# Patient Record
Sex: Female | Born: 1950 | Race: White | Hispanic: No | Smoking: Former smoker
Health system: Southern US, Community
[De-identification: ages and names within clinical notes are randomized; demographics above are authoritative.]

## PROBLEM LIST (undated history)

## (undated) DIAGNOSIS — Z5189 Encounter for other specified aftercare: Secondary | ICD-10-CM

## (undated) DIAGNOSIS — E119 Type 2 diabetes mellitus without complications: Secondary | ICD-10-CM

## (undated) DIAGNOSIS — J449 Chronic obstructive pulmonary disease, unspecified: Secondary | ICD-10-CM

## (undated) HISTORY — PX: ABDOMINAL HYSTERECTOMY: SHX81

## (undated) HISTORY — PX: CHOLECYSTECTOMY: SHX55

## (undated) HISTORY — PX: BREAST SURGERY: SHX581

---

## 2020-04-30 ENCOUNTER — Emergency Department: Payer: Medicare HMO

## 2020-04-30 ENCOUNTER — Emergency Department
Admission: EM | Admit: 2020-04-30 | Discharge: 2020-04-30 | Disposition: A | Discharge status: Home / Self Care | Payer: Medicare HMO | Attending: Student | Admitting: Student

## 2020-04-30 ENCOUNTER — Encounter: Payer: Self-pay | Admitting: Emergency Medicine

## 2020-04-30 ENCOUNTER — Other Ambulatory Visit: Payer: Self-pay

## 2020-04-30 DIAGNOSIS — Y929 Unspecified place or not applicable: Secondary | ICD-10-CM | POA: Insufficient documentation

## 2020-04-30 DIAGNOSIS — W010XXA Fall on same level from slipping, tripping and stumbling without subsequent striking against object, initial encounter: Secondary | ICD-10-CM | POA: Diagnosis not present

## 2020-04-30 DIAGNOSIS — S59911A Unspecified injury of right forearm, initial encounter: Secondary | ICD-10-CM | POA: Diagnosis present

## 2020-04-30 DIAGNOSIS — Z79899 Other long term (current) drug therapy: Secondary | ICD-10-CM | POA: Diagnosis not present

## 2020-04-30 DIAGNOSIS — S52501A Unspecified fracture of the lower end of right radius, initial encounter for closed fracture: Secondary | ICD-10-CM | POA: Diagnosis not present

## 2020-04-30 DIAGNOSIS — J449 Chronic obstructive pulmonary disease, unspecified: Secondary | ICD-10-CM | POA: Insufficient documentation

## 2020-04-30 DIAGNOSIS — Z87891 Personal history of nicotine dependence: Secondary | ICD-10-CM | POA: Insufficient documentation

## 2020-04-30 DIAGNOSIS — Z7982 Long term (current) use of aspirin: Secondary | ICD-10-CM | POA: Diagnosis not present

## 2020-04-30 DIAGNOSIS — E119 Type 2 diabetes mellitus without complications: Secondary | ICD-10-CM | POA: Diagnosis not present

## 2020-04-30 DIAGNOSIS — Y939 Activity, unspecified: Secondary | ICD-10-CM | POA: Diagnosis not present

## 2020-04-30 DIAGNOSIS — Y999 Unspecified external cause status: Secondary | ICD-10-CM | POA: Diagnosis not present

## 2020-04-30 HISTORY — DX: Encounter for other specified aftercare: Z51.89

## 2020-04-30 HISTORY — DX: Chronic obstructive pulmonary disease, unspecified: J44.9

## 2020-04-30 HISTORY — DX: Type 2 diabetes mellitus without complications: E11.9

## 2020-04-30 MED ORDER — FENTANYL CITRATE (PF) 100 MCG/2ML IJ SOLN
25.0000 ug | Freq: Once | INTRAMUSCULAR | Status: AC
  Administered 2020-04-30: 25 ug via INTRAVENOUS
  Filled 2020-04-30: qty 2

## 2020-04-30 MED ORDER — LIDOCAINE HCL (PF) 1 % IJ SOLN
INTRAMUSCULAR | Status: AC
  Administered 2020-04-30: 30 mL
  Filled 2020-04-30: qty 10

## 2020-04-30 MED ORDER — LIDOCAINE HCL 1 % IJ SOLN: 30.0000 mL | Freq: Once | INTRAMUSCULAR | Status: AC

## 2020-04-30 MED ORDER — OXYCODONE-ACETAMINOPHEN 5-325 MG PO TABS: 1.0000 | ORAL_TABLET | Freq: Three times a day (TID) | ORAL | 0 refills | Status: AC | PRN

## 2020-04-30 NOTE — ED Provider Notes (Signed)
[facility] Department Provider Note  ____________________________________________  Time seen: Approximately 3:36 PM  I have reviewed the triage vital signs and the nursing notes.   HISTORY  Chief Complaint Fall    HPI Joy Blanchard is a 69 y.o. female that presents to emergency department for evaluation after fall.  Patient states that she tripped on little grandchildren's toys.  She landed on her buttocks but used her right wrist to brace the fall.  She did not hit her head or lose consciousness.  She is not on any blood thinners.   Past Medical History:  Diagnosis Date  . Blood transfusion without reported diagnosis   . COPD (chronic obstructive pulmonary disease) ([place])   . Diabetes mellitus without complication ([place])     There are no problems to display for this patient.   Past Surgical History:  Procedure Laterality Date  . ABDOMINAL HYSTERECTOMY    . BREAST SURGERY    . CHOLECYSTECTOMY      Prior to Admission medications   Medication Sig Start Date End Date Taking? Authorizing Provider  albuterol (VENTOLIN HFA) 108 (90 Base) MCG/ACT inhaler Inhale 2 puffs into the lungs every 6 (six) hours as needed for wheezing or shortness of breath.   Yes [provider]  aspirin 81 MG chewable tablet Chew 81 mg by mouth daily.   Yes [provider]  atorvastatin (LIPITOR) 10 MG tablet Take 10 mg by mouth daily.   Yes [provider]  Fluticasone-Salmeterol (ADVAIR) 250-50 MCG/DOSE AEPB Inhale 1 puff into the lungs 2 (two) times daily.   Yes [provider]  meloxicam (MOBIC) 15 MG tablet Take 15 mg by mouth daily.   Yes [provider]  oxyCODONE-acetaminophen (PERCOCET) 5-325 MG tablet Take 1 tablet by mouth every 8 (eight) hours as needed for up to 3 days for severe pain. 04/30/20 05/03/20  Laban Emperor, PA-C    Allergies Patient has no known allergies.  No family history on file.  Social  History Social History   Tobacco Use  . Smoking status: Former Research scientist (life sciences)  . Smokeless tobacco: Never Used  Substance Use Topics  . Alcohol use: Not on file  . Drug use: Not on file     Review of Systems  Respiratory: No SOB. Gastrointestinal: No abdominal pain.  No nausea, no vomiting.  Musculoskeletal: Positive for wrist pain. Skin: Negative for rash, abrasions, lacerations, ecchymosis. Neurological: Negative for headaches, numbness or tingling   ____________________________________________   PHYSICAL EXAM:  VITAL SIGNS: ED Triage Vitals  Enc Vitals Group     BP 04/30/20 1502 140/69     Pulse Rate 04/30/20 1502 78     Resp 04/30/20 1502 16     Temp 04/30/20 1502 98.1 F (36.7 C)     Temp Source 04/30/20 1502 Oral     SpO2 04/30/20 1502 96 %     Weight 04/30/20 1504 125 lb (56.7 kg)     Height 04/30/20 1504 5\' 3"  (1.6 m)     Head Circumference --      Peak Flow --      Pain Score 04/30/20 1504 6     Pain Loc --      Pain Edu? --      Excl. in [place]? --      Constitutional: Alert and oriented. Well appearing and in no acute distress. Eyes: Conjunctivae are normal. PERRL. EOMI. Head: Atraumatic. ENT:      Ears:  Blanchard: No congestion/rhinnorhea.      Mouth/Throat: Mucous membranes are moist.  Neck: No stridor.  Cardiovascular: Normal rate, regular rhythm.  Good peripheral circulation. Symmetric radial pulses. Respiratory: Normal respiratory effort without tachypnea or retractions. Lungs CTAB. Good air entry to the bases with no decreased or absent breath sounds. Musculoskeletal: Full range of motion to all extremities. No gross deformities appreciated. Deformity to right wrist. Neurologic:  Normal speech and language. No gross focal neurologic deficits are appreciated.  Skin:  Skin is warm, dry and intact. No rash noted. Psychiatric: Mood and affect are normal. Speech and behavior are normal. Patient exhibits appropriate insight and  judgement.   ____________________________________________   LABS (all labs ordered are listed, but only abnormal results are displayed)  Labs Reviewed - No data to display ____________________________________________  EKG   ____________________________________________  RADIOLOGY Joy Blanchard, personally viewed and evaluated these images (plain radiographs) as part of my medical decision making, as well as reviewing the written report by the radiologist.  DG Wrist Complete Right  Result Date: 04/30/2020 CLINICAL DATA:  Postreduction EXAM: RIGHT WRIST - COMPLETE 3+ VIEW COMPARISON:  04/30/2020 FINDINGS: Distal right radial fracture again noted with continued mild displacement. Decreasing posterior angulation with mild residual angulation. Displaced ulnar styloid fracture again noted. IMPRESSION: Continued displacement and mild posterior angulation, slightly decreased since prior study. Electronically Signed   By: Joy Blanchard M.D.   On: 04/30/2020 18:21   DG Wrist Complete Right  Result Date: 04/30/2020 CLINICAL DATA:  Fall with pain and deformity. EXAM: RIGHT WRIST - COMPLETE 3+ VIEW COMPARISON:  None. FINDINGS: Transverse fracture of the distal radial metaphysis with volar angulation and dorsal tilt of the distal radial articular surface. Old appearing ulnar styloid fracture which is nonunited. No acute fracture of the carpus or the metacarpals. IMPRESSION: Complete transverse fracture of the distal radial metaphysis with volar angulation. Old nonunited fracture of the ulnar styloid. Electronically Signed   By: Joy Blanchard M.D.   On: 04/30/2020 15:37   CT Head Wo Contrast  Result Date: 04/30/2020 CLINICAL DATA:  Status post fall. EXAM: CT HEAD WITHOUT CONTRAST TECHNIQUE: Contiguous axial images were obtained from the base of the skull through the vertex without intravenous contrast. COMPARISON:  None. FINDINGS: Brain: There is mild cerebral atrophy with widening of the extra-axial  spaces and ventricular dilatation. There are areas of decreased attenuation within the white matter tracts of the supratentorial brain, consistent with microvascular disease changes. Vascular: No hyperdense vessel or unexpected calcification. Skull: Normal. Negative for fracture or focal lesion. Sinuses/Orbits: No acute finding. Other: None. IMPRESSION: 1. Generalized cerebral atrophy. 2. No acute intracranial abnormality. Electronically Signed   By: Joy Blanchard M.D.   On: 04/30/2020 16:21   CT Cervical Spine Wo Contrast  Result Date: 04/30/2020 CLINICAL DATA:  Status post fall. EXAM: CT CERVICAL SPINE WITHOUT CONTRAST TECHNIQUE: Multidetector CT imaging of the cervical spine was performed without intravenous contrast. Multiplanar CT image reconstructions were also generated. COMPARISON:  None. FINDINGS: Alignment: There is approximately 3 mm anterolisthesis of the C3 on C4 vertebral body. Skull base and vertebrae: No acute fracture. No primary bone lesion or focal pathologic process. Soft tissues and spinal canal: No prevertebral fluid or swelling. No visible canal hematoma. Disc levels: Moderate to marked severity endplate sclerosis is seen at the levels of C5-C6 and C6-C7. Marked severity intervertebral disc space narrowing is also seen at this level. Moderate severity intervertebral disc space narrowing is seen at the levels of C4-C5  and C6-C7. Mild to moderate severity multilevel bilateral facet joint hypertrophy is seen. Upper chest: Negative. Other: None. IMPRESSION: 1. No acute osseous abnormality. 2. Marked severity multilevel degenerative changes, most prominent at the levels of C5-C6 and C6-C7. 3. There is approximately 3 mm anterolisthesis of the C3 on C4 vertebral body. Electronically Signed   By: Joy Blanchard M.D.   On: 04/30/2020 16:24    ____________________________________________    PROCEDURES  Procedure(s) performed:    .Ortho Injury Treatment  Date/Time: 04/30/2020  7:17 PM Performed by: Joy Derry, PA-C Authorized by: Joy Derry, PA-C   Consent:    Consent obtained:  Verbal   Consent given by:  Patient   Risks discussed:  Fracture, nerve damage, restricted joint movement and vascular damage   Alternatives discussed:  No treatment, alternative treatment, immobilization, referral and delayed treatmentInjury location: forearm Location details: right forearm Injury type: fracture Fracture type: distal radius Pre-procedure neurovascular assessment: neurovascularly intact Pre-procedure distal perfusion: normal Pre-procedure neurological function: normal Anesthesia: hematoma block  Anesthesia: Local anesthesia used: yes Local Anesthetic: lidocaine 1% without epinephrine  Patient sedated: NoManipulation performed: yes Skin traction used: yes Skeletal traction used: yes Reduction successful: yes X-ray confirmed reduction: yes Immobilization: splint Splint type: sugar tong Supplies used: cotton padding,  Ortho-Glass and elastic bandage Post-procedure neurovascular assessment: post-procedure neurovascularly intact Post-procedure distal perfusion: normal Post-procedure neurological function: normal Post-procedure range of motion: improved Patient tolerance: patient tolerated the procedure well with no immediate complications       Medications  fentaNYL (SUBLIMAZE) injection 25 mcg (25 mcg Intravenous Given 04/30/20 1603)  lidocaine (XYLOCAINE) 1 % (with pres) injection 30 mL (30 mLs Other Given by Other 04/30/20 1913)  fentaNYL (SUBLIMAZE) injection 25 mcg (25 mcg Intravenous Given 04/30/20 1711)     ____________________________________________   INITIAL IMPRESSION / ASSESSMENT AND PLAN / ED COURSE  Pertinent labs & imaging results that were available during my care of the patient were reviewed by me and considered in my medical decision making (see chart for details).  Review of the [facility] CSRS was performed in accordance of the [facility]  prior to dispensing any controlled drugs.    Patient's diagnosis is consistent with distal radius fracture.  Vital signs and exam are reassuring.  X-ray reveals a complete transverse fracture of the distal radial metaphysis with volar angulation.  Fracture was reduced by myself and Dr. Colon Blanchard. Improved alignment post reduction. Splint was placed. Sling was given. Patient will be discharged home with prescriptions for percocet. Patient is to follow up with orthopedics as directed. Referral was given. Patient is given ED precautions to return to the ED for any worsening or new symptoms.   Joy Blanchard was evaluated in Emergency Department on 04/30/2020 for the symptoms described in the history of present illness. She was evaluated in the context of the global COVID-19 pandemic, which necessitated consideration that the patient might be at risk for infection with the SARS-CoV-2 virus that causes COVID-19. Institutional protocols and algorithms that pertain to the evaluation of patients at risk for COVID-19 are in a state of rapid change based on information released by regulatory bodies including the CDC and federal and state organizations. These policies and algorithms were followed during the patient's care in the ED.  ____________________________________________  FINAL CLINICAL IMPRESSION(S) / ED DIAGNOSES  Final diagnoses:  Closed fracture of distal end of right radius, unspecified fracture morphology, initial encounter      NEW MEDICATIONS STARTED DURING THIS VISIT:  ED Discharge Orders  Ordered    oxyCODONE-acetaminophen (PERCOCET) 5-325 MG tablet  Every 8 hours PRN     04/30/20 1905              This chart was dictated using voice recognition software/Dragon. Despite best efforts to proofread, errors can occur which can change the meaning. Any change was purely unintentional.    Joy Derry, PA-C 04/30/20 2301    Joy Blanchard., MD 05/01/20 1122

## 2020-04-30 NOTE — ED Triage Notes (Signed)
Presents s/p trip and fall  States she landed on right wrist  Wrist is splinted on arrival   Positive deformity  Good pulses

## 2020-04-30 NOTE — Discharge Instructions (Signed)
You have a fracture of your radius.  Please continue to wear your splint.  Please call Dr. Odis Luster in the morning for a follow-up appointment.

## 2021-06-01 IMAGING — CT CT CERVICAL SPINE W/O CM
3 of 4 series · 10 of 33 positions shown, 12 images · non-contrast
Comparison: None.

CLINICAL DATA: Status post fall.

EXAM:
CT CERVICAL SPINE WITHOUT CONTRAST
TECHNIQUE: Multidetector CT imaging of the cervical spine was performed without
intravenous contrast. Multiplanar CT image reconstructions were also
generated.

[Series 6: sagittal bone · sagittal · 0.24mm/px · 5 of 48 slices shown, 6 images]
[im 16/48  bone]
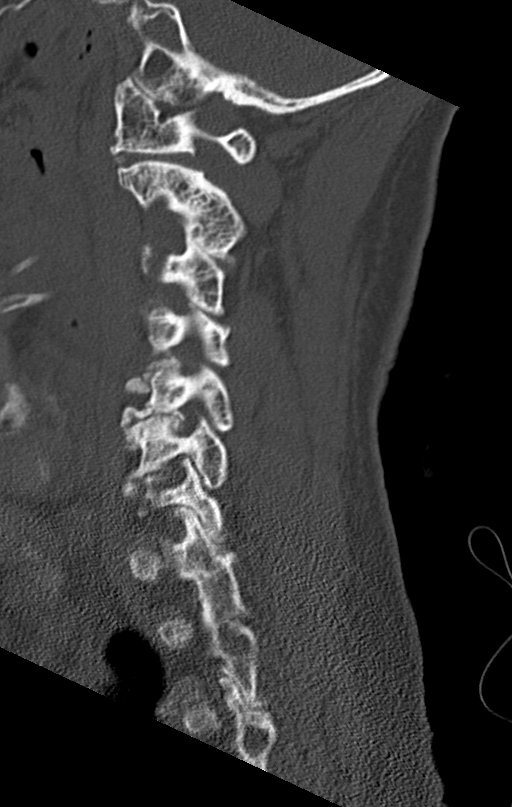
[im 20/48  bone]
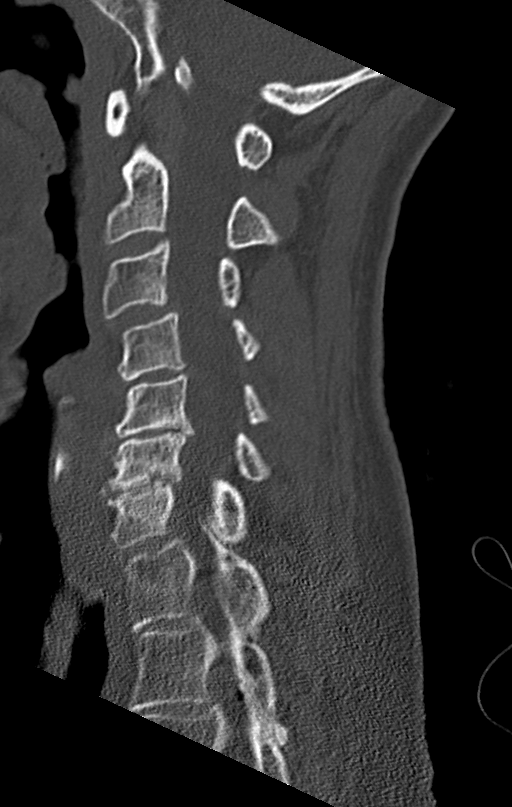
[im 24/48  soft-tissue]
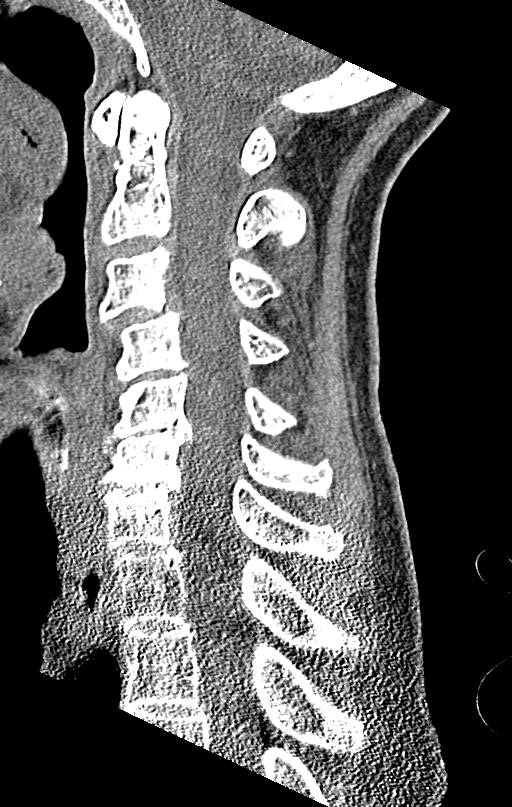
[im 24/48  bone]
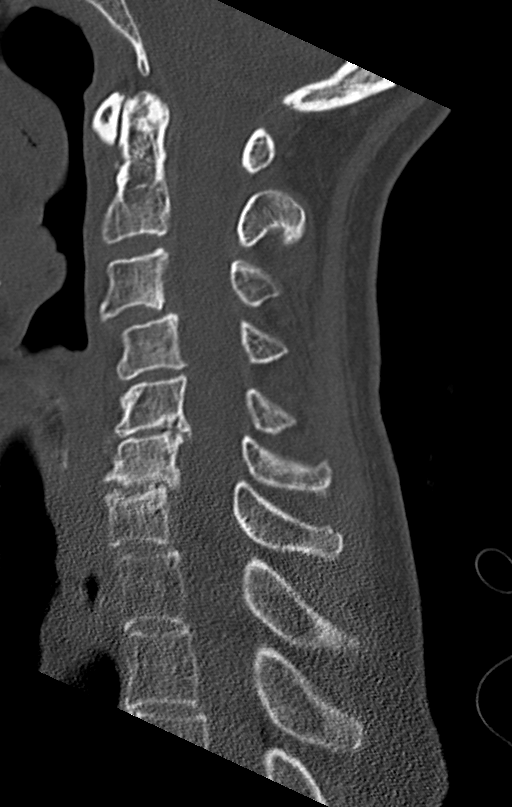
[im 28/48  bone]
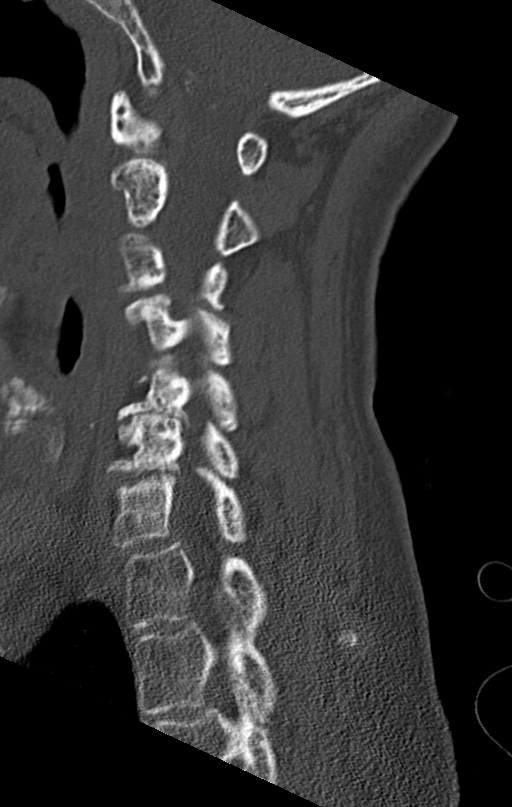
[im 32/48  bone]
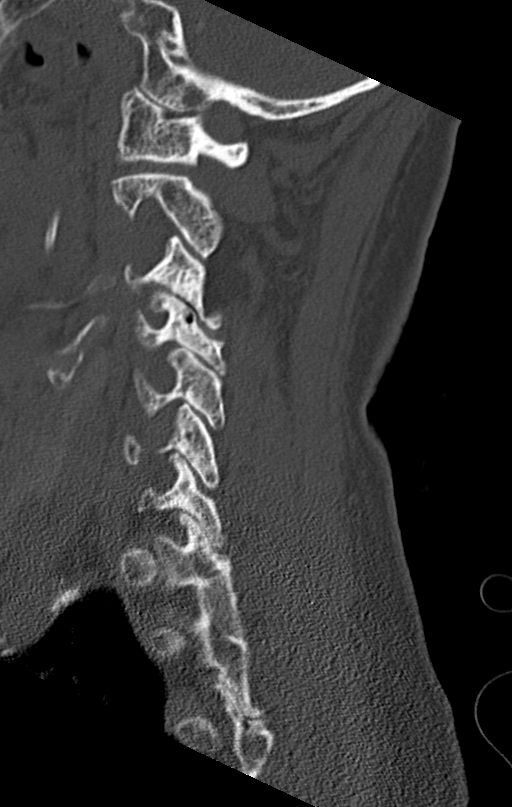

[Series 7: coronal bone · coronal · 0.19mm/px · 3 of 63 slices shown]
[im 14/63  bone]
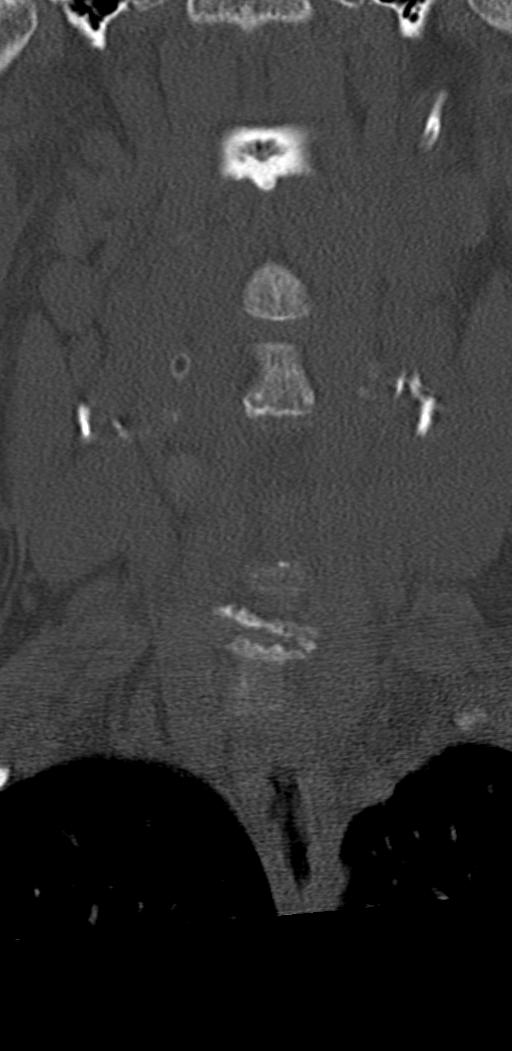
[im 26/63  bone]
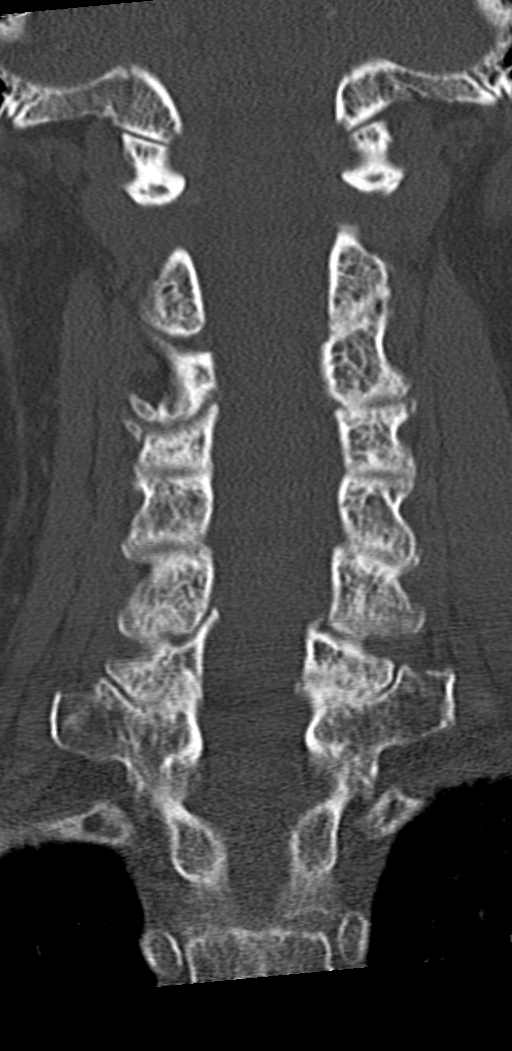
[im 37/63  bone]
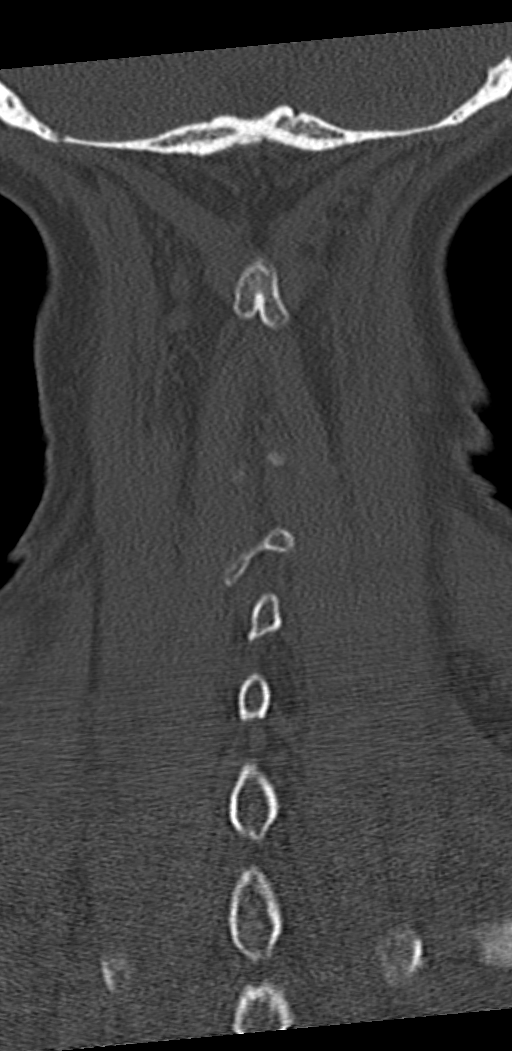

[Series 8: orthogonal bone · axial · 0.19mm/px · z∈[-295,-219]mm · 2 of 99 slices shown, 3 images]
[im 29/99  soft-tissue]
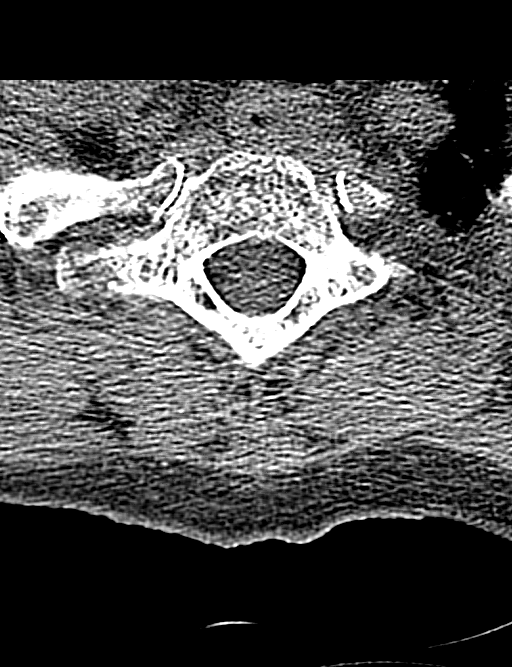
[im 29/99  bone]
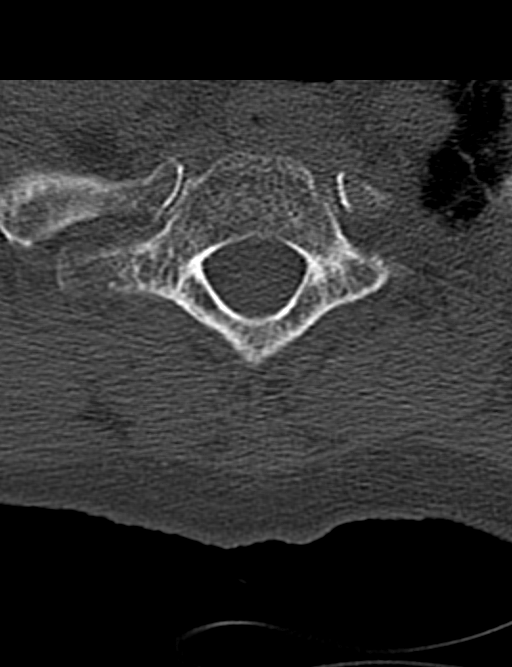
[im 71/99  bone]
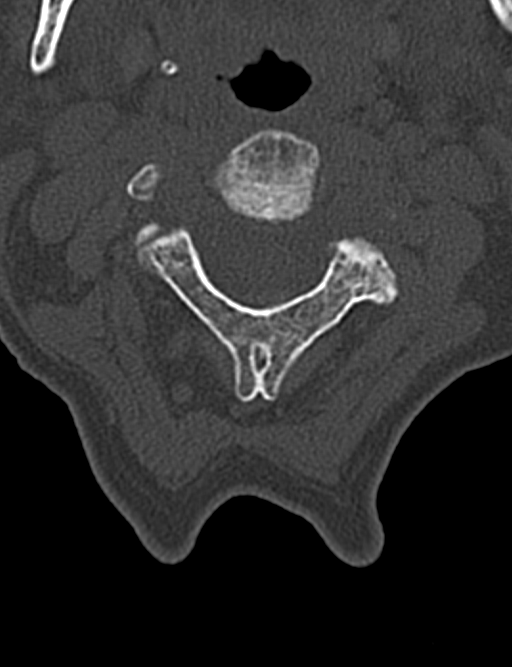

[10 of 33 positions shown; findings below may reference images not displayed]

FINDINGS: Alignment: There is approximately 3 mm anterolisthesis of the C3 on
C4 vertebral body.

Skull base and vertebrae: No acute fracture. No primary bone lesion
or focal pathologic process.

Soft tissues and spinal canal: No prevertebral fluid or swelling. No
visible canal hematoma.

Disc levels: Moderate to marked severity endplate sclerosis is seen
at the levels of C5-C6 and C6-C7. Marked severity intervertebral
disc space narrowing is also seen at this level. Moderate severity
intervertebral disc space narrowing is seen at the levels of C4-C5
and C6-C7.

Mild to moderate severity multilevel bilateral facet joint
hypertrophy is seen.

Upper chest: Negative.

Other: None.
IMPRESSION: 1. No acute osseous abnormality.
2. Marked severity multilevel degenerative changes, most prominent
at the levels of C5-C6 and C6-C7.
3. There is approximately 3 mm anterolisthesis of the C3 on C4
vertebral body.

## 2021-06-01 IMAGING — CT CT HEAD W/O CM
3 series · 16 of 47 positions shown, 19 images · non-contrast
Comparison: None.

CLINICAL DATA: Status post fall.

EXAM:
CT HEAD WITHOUT CONTRAST
TECHNIQUE: Contiguous axial images were obtained from the base of the skull
through the vertex without intravenous contrast.

[Series 2: head wo · axial · 0.47mm/px · z∈[-147,-22]mm · 10 of 30 slices shown, 13 images]
[im 3/30  brain]
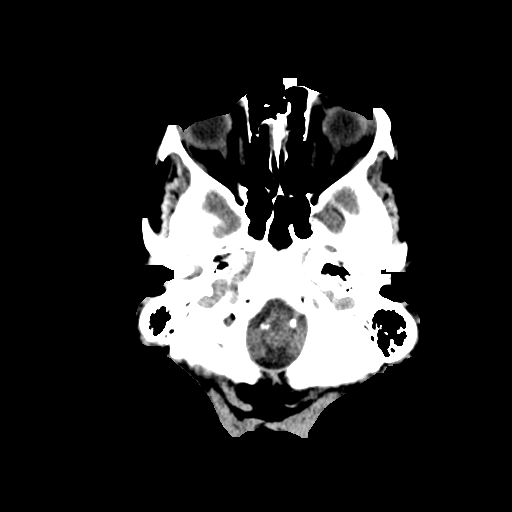
[im 3/30  bone]
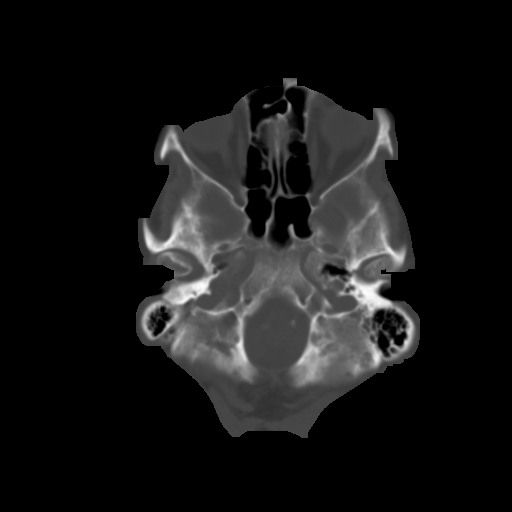
[im 6/30  brain]
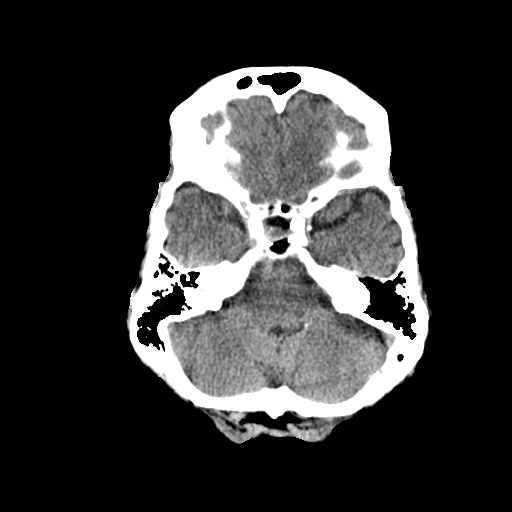
[im 9/30  brain]
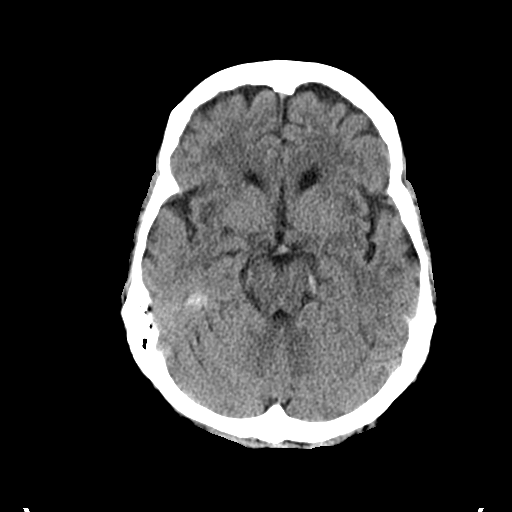
[im 11/30  brain]
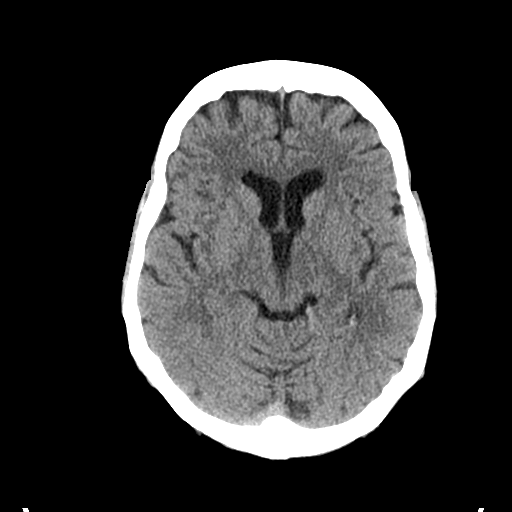
[im 14/30  brain]
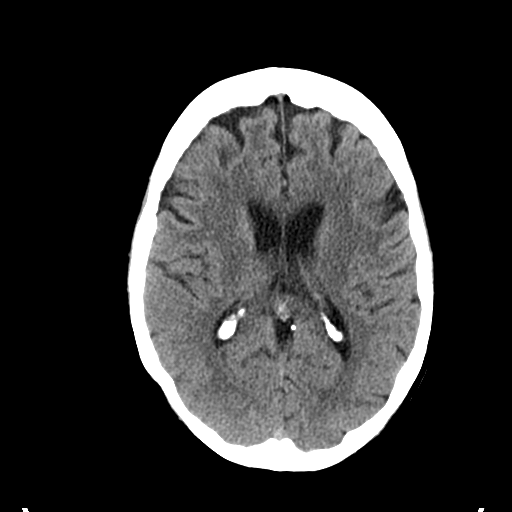
[im 14/30  bone]
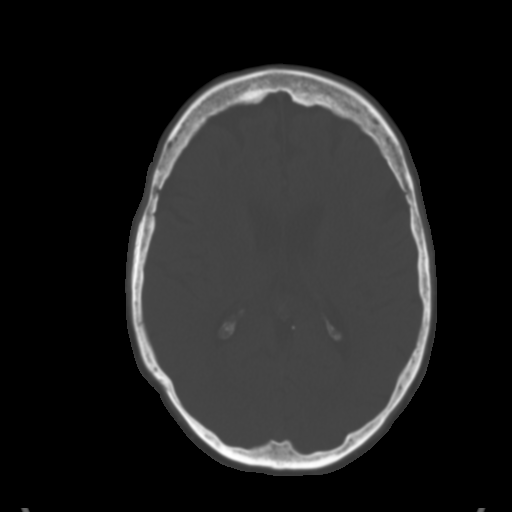
[im 17/30  brain]
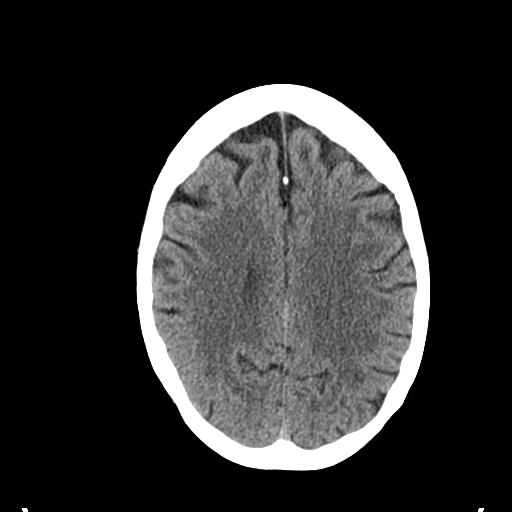
[im 20/30  brain]
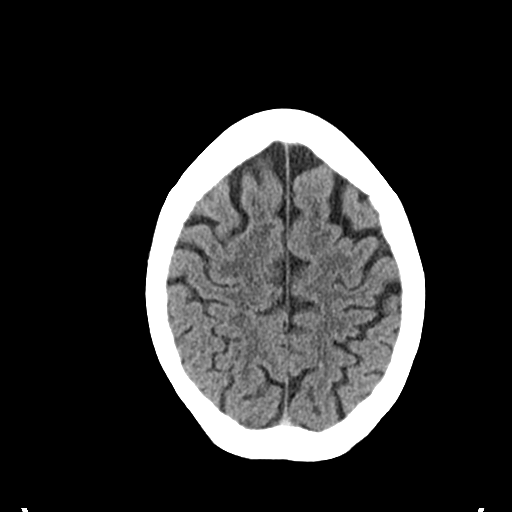
[im 23/30  brain]
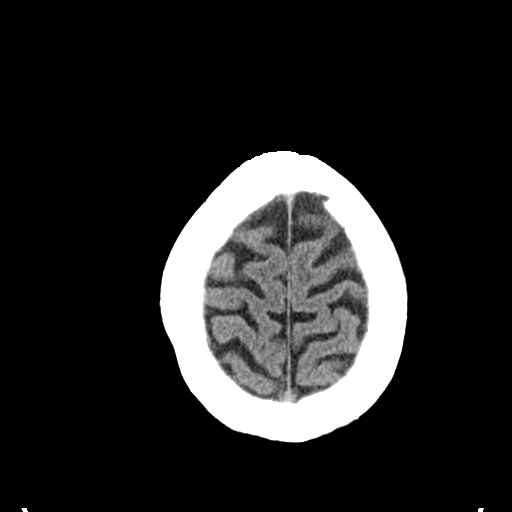
[im 25/30  brain]
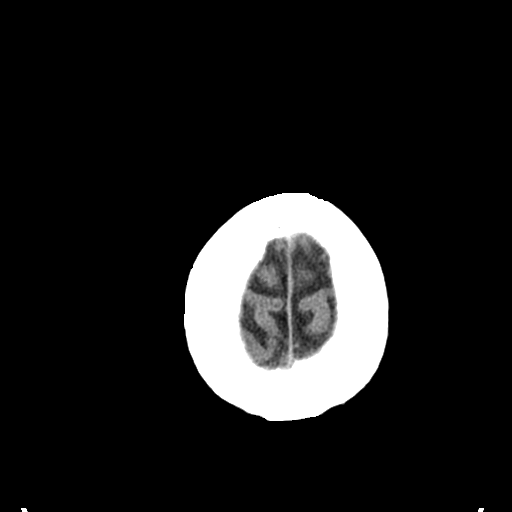
[im 25/30  bone]
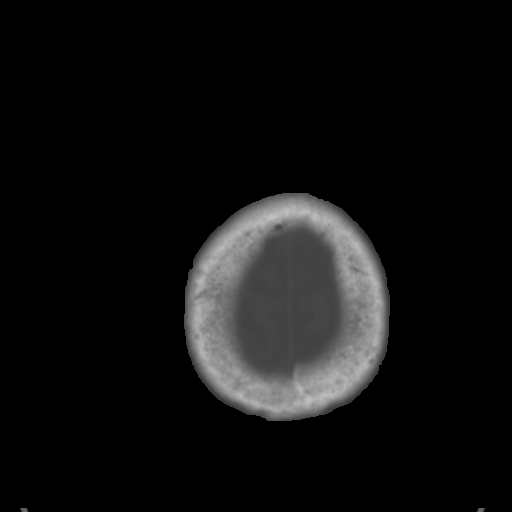
[im 28/30  brain]
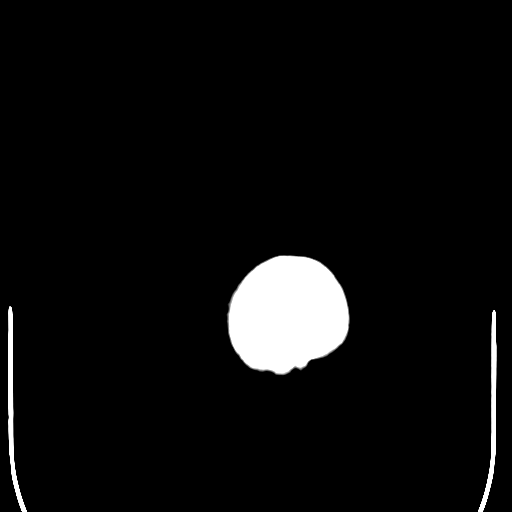

[Series 4: coronal soft tissue · coronal · 0.31mm/px · 3 of 66 slices shown]
[im 22/66  brain]
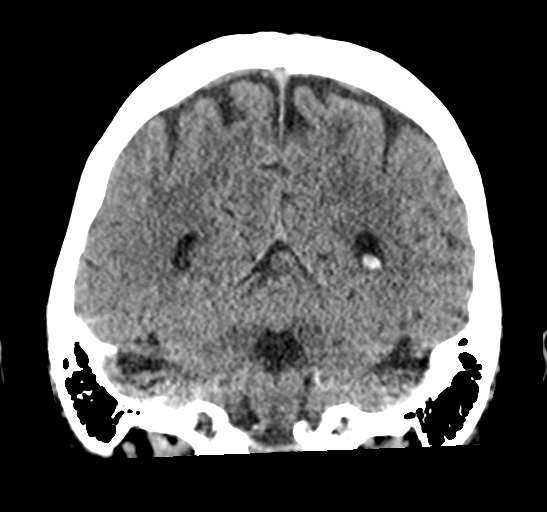
[im 29/66  brain]
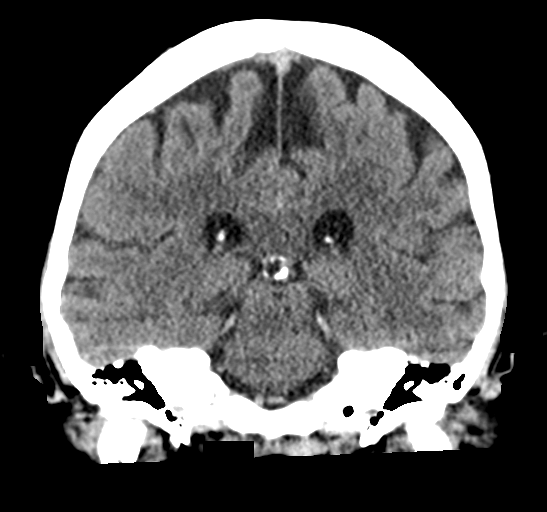
[im 37/66  brain]
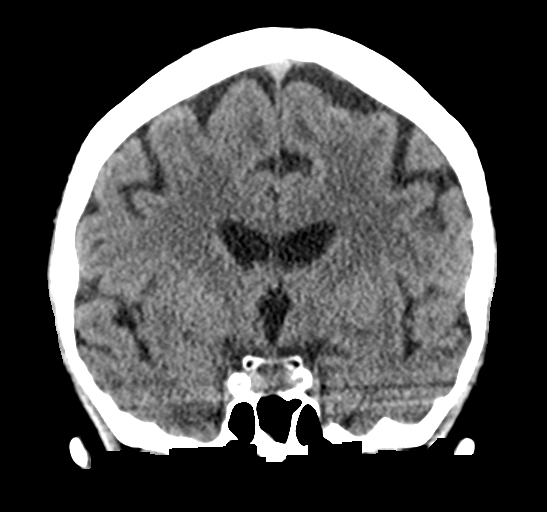

[Series 5: sagittal soft tissue · sagittal · 0.31mm/px · 3 of 58 slices shown]
[im 20/58  brain]
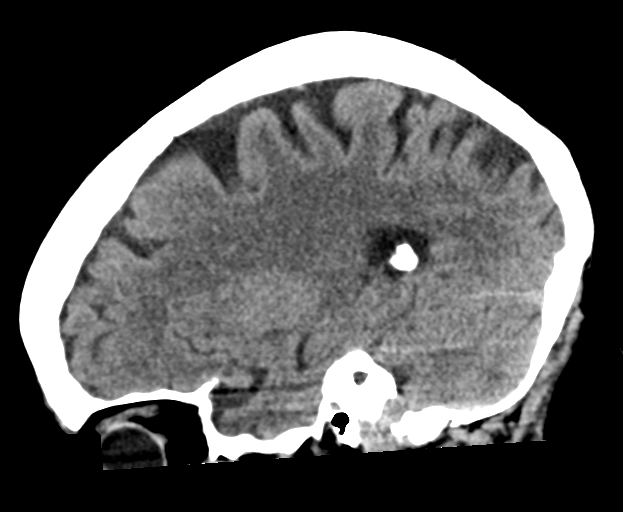
[im 29/58  brain]
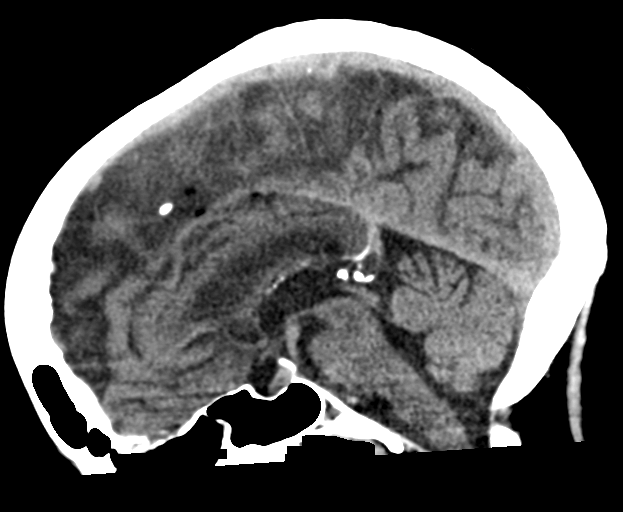
[im 39/58  brain]
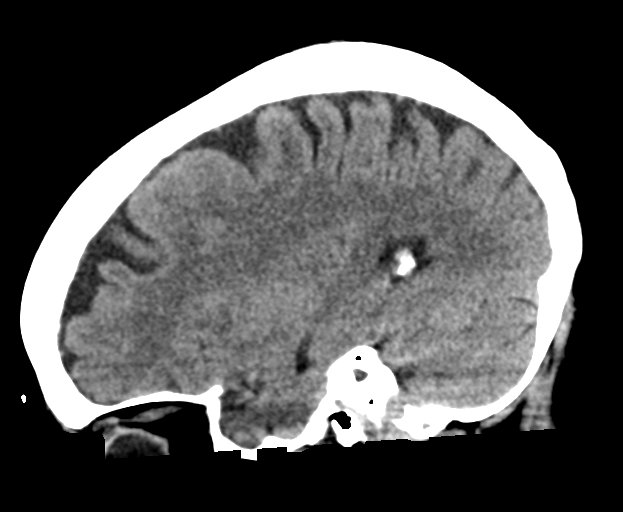

[16 of 47 positions shown; findings below may reference images not displayed]

FINDINGS: Brain: There is mild cerebral atrophy with widening of the
extra-axial spaces and ventricular dilatation.
There are areas of decreased attenuation within the white matter
tracts of the supratentorial brain, consistent with microvascular
disease changes.

Vascular: No hyperdense vessel or unexpected calcification.

Skull: Normal. Negative for fracture or focal lesion.

Sinuses/Orbits: No acute finding.

Other: None.
IMPRESSION: 1. Generalized cerebral atrophy.
2. No acute intracranial abnormality.

## 2023-08-15 DEATH — deceased
